# Patient Record
Sex: Male | Born: 1985 | Race: White | Hispanic: No | Marital: Married | Smoking: Former smoker
Health system: Southern US, Community
[De-identification: ages and names within clinical notes are randomized; demographics above are authoritative.]

## PROBLEM LIST (undated history)

## (undated) DIAGNOSIS — J45909 Unspecified asthma, uncomplicated: Secondary | ICD-10-CM

## (undated) DIAGNOSIS — R011 Cardiac murmur, unspecified: Secondary | ICD-10-CM

## (undated) HISTORY — PX: PULMONARY ARTERY BALLOON ANGIOPLASTY: SHX277

---
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
## 2003-09-17 ENCOUNTER — Ambulatory Visit (HOSPITAL_COMMUNITY): Admission: RE | Admit: 2003-09-17 | Discharge: 2003-09-17 | Payer: Self-pay | Admitting: *Deleted

## 2003-09-17 ENCOUNTER — Encounter: Payer: Self-pay | Admitting: Internal Medicine

## 2003-09-17 ENCOUNTER — Encounter: Admission: RE | Admit: 2003-09-17 | Discharge: 2003-09-17 | Payer: Self-pay | Admitting: *Deleted

## 2004-10-16 ENCOUNTER — Ambulatory Visit: Payer: Self-pay | Admitting: Internal Medicine

## 2004-11-05 ENCOUNTER — Ambulatory Visit: Payer: Self-pay

## 2005-10-02 ENCOUNTER — Ambulatory Visit: Payer: Self-pay | Admitting: Internal Medicine

## 2006-12-14 ENCOUNTER — Emergency Department (HOSPITAL_COMMUNITY): Admission: EM | Admit: 2006-12-14 | Discharge: 2006-12-14 | Payer: Self-pay | Admitting: Emergency Medicine

## 2008-04-02 ENCOUNTER — Ambulatory Visit: Payer: Self-pay | Admitting: Internal Medicine

## 2009-01-28 ENCOUNTER — Encounter: Payer: Self-pay | Admitting: Internal Medicine

## 2009-01-28 DIAGNOSIS — Q221 Congenital pulmonary valve stenosis: Secondary | ICD-10-CM | POA: Insufficient documentation

## 2009-02-11 ENCOUNTER — Encounter (INDEPENDENT_AMBULATORY_CARE_PROVIDER_SITE_OTHER): Payer: Self-pay | Admitting: *Deleted

## 2009-05-14 ENCOUNTER — Emergency Department (HOSPITAL_COMMUNITY): Admission: EM | Admit: 2009-05-14 | Discharge: 2009-05-15 | Payer: Self-pay | Admitting: Emergency Medicine

## 2009-05-22 ENCOUNTER — Emergency Department (HOSPITAL_COMMUNITY): Admission: EM | Admit: 2009-05-22 | Discharge: 2009-05-22 | Payer: Self-pay | Admitting: Emergency Medicine

## 2010-02-12 ENCOUNTER — Ambulatory Visit: Payer: Self-pay

## 2010-03-09 ENCOUNTER — Encounter: Payer: Self-pay | Admitting: *Deleted

## 2010-03-20 NOTE — Letter (Signed)
Summary: Pediatric Teaching Program  Pediatric Teaching Program   Imported By: Marylou Mccoy 12/02/2009 10:46:27  _____________________________________________________________________  External Attachment:    Type:   Image     Comment:   External Document

## 2010-04-11 ENCOUNTER — Encounter: Payer: Self-pay | Admitting: Internal Medicine

## 2010-04-11 ENCOUNTER — Ambulatory Visit (INDEPENDENT_AMBULATORY_CARE_PROVIDER_SITE_OTHER): Payer: Self-pay | Admitting: Internal Medicine

## 2010-04-11 ENCOUNTER — Ambulatory Visit (HOSPITAL_COMMUNITY): Payer: Self-pay | Attending: Internal Medicine

## 2010-04-11 ENCOUNTER — Other Ambulatory Visit: Payer: Self-pay | Admitting: Internal Medicine

## 2010-04-11 DIAGNOSIS — F17200 Nicotine dependence, unspecified, uncomplicated: Secondary | ICD-10-CM | POA: Insufficient documentation

## 2010-04-11 DIAGNOSIS — Q231 Congenital insufficiency of aortic valve: Secondary | ICD-10-CM | POA: Insufficient documentation

## 2010-04-11 DIAGNOSIS — I369 Nonrheumatic tricuspid valve disorder, unspecified: Secondary | ICD-10-CM

## 2010-04-11 DIAGNOSIS — R55 Syncope and collapse: Secondary | ICD-10-CM

## 2010-04-24 NOTE — Assessment & Plan Note (Signed)
Summary: f2y/per Annice Pih rn/mb/ac   Visit Type:  2 year follow up   CC:  shortness of breath. dizziness.  History of Present Illness: IDENTIFICATION:  Mr. Roger Dudley is a 25 year old who I saw a few years ago in clinic in 2010.  He has a history of pulmonic stenosis, underwent pulmonary valvuloplasty at age 61, was left with mild stenosis and mild-to-moderate insufficiency.  Last echocardiogram was done in 2006 since seen he has done well.  He denies chst pains.  Breahing is good.  He notes occasional dizziness with change in position.  NO syncope.  No palpitations. he continues to smoke.  Is down to 1/2 ppd.  Preventive Screening-Counseling & Management  Alcohol-Tobacco     Smoking Status: current  Caffeine-Diet-Exercise     Does Patient Exercise: yes      Drug Use:  yes.    Current Medications (verified): 1)  Proventil Hfa 108 (90 Base) Mcg/act Aers (Albuterol Sulfate) .... 2 Puffs Every 4-6 Hours As Needed  Allergies (verified): 1)  ! Asa  Past History:  Past medical, surgical, family and social histories (including risk factors) reviewed, and no changes noted (except as noted below).  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 Family History Coronary Heart Disease male < 45:  Family History of CVA or Stroke:  Family History of Hyperlipidemia:  Family History of Hypertension:  Family History of Seizure Disorder:   Social History: Reviewed history from 04/09/2010 and no changes required. smoke a 1/2 pack per day Full Time Single  Tobacco Use - Yes.  Alcohol Use - no Regular Exercise - yes / tries too Drug Use - yes Smoking Status:  current Does Patient Exercise:  yes Drug Use:  yes  Review of Systems       All systmes reivewed.  Neg to the above problem except as noted above.  Vital Signs:  Patient profile:   25 year old male Height:       73 inches Weight:      188 pounds BMI:     24.89 Pulse rate:   52 / minute BP sitting:   102 / 65  (left arm) Cuff size:   regular  Vitals Entered By: Caralee Ates CMA (April 11, 2010 1:02 PM)  Physical Exam  Additional Exam:  patient is in NAD HEENT:  Normocephalic, atraumatic. EOMI, PERRLA.  Neck: JVP is normal. No thyromegaly. No bruits.  Lungs: clear to auscultation. No rales no wheezes.  Heart: Regular rate and rhythm. Normal S1, S2. No S3.   Gr I/VI systolid mumur LUSB.  No RV heave.   PMI not displaced.  Abdomen:  Supple, nontender. Normal bowel sounds. No masses. No hepatomegaly.  Extremities:   Good distal pulses throughout. No lower extremity edema.  Musculoskeletal :moving all extremities.  Neuro:   alert and oriented x3.    EKG  Procedure date:  04/11/2010  Findings:      Sinus bradycardia  45 bpm.  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  PS is mild.  PI is mild to moderate.  RV is mildly enlarged but function is normal I would f/u in 2 years with exam.  Problem # 2:  TOBACCO ABUSE (ICD-305.1) COunselled on quitting.  Other Orders: EKG w/ Interpretation (93000)  Patient Instructions: 1)  Your physician wants you to follow-up in:18  months with Dr.Kyle Stansell You will receive a reminder letter in the mail two months in advance. If you don't receive a letter, please call our office to schedule the follow-up appointment.
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
## 2010-07-04 NOTE — Consult Note (Signed)
NAME:  Roger Dudley, Roger Dudley                      ACCOUNT NO.:  [id number]   MEDICAL RECORD NO.:  [id number]                   PATIENT TYPE:  INP   LOCATION:  5007                                 FACILITY:   PHYSICIAN:  Gabrielle Dare. Janee Morn, M.D.             DATE OF BIRTH:  [birth year]   DATE OF CONSULTATION:  04/25/2002  DATE OF DISCHARGE:                                   CONSULTATION   CHIEF COMPLAINT:  Infection right leg.   HISTORY OF PRESENT ILLNESS:  The patient is an 25 year old male with past  medical history of asthma and pulmonary stenosis who complains of increasing  pain and swelling in his right lower leg with redness since last Friday.  He  was seen in the emergency department and treated with Keflex and discharged  and followed up with his primary doctor on Monday.  He was given Rocephin IM  and p.o. Augmentin.  He returned for followup with Dr. Marina Goodell of the [facility]  [facility] today and he was referred to [facility] for admission.  I was asked to  evaluate him for possible incision and drainage of this infection.  Currently the patient is complaining of pain and tenderness in the area.  He  has no other complaints.   PAST MEDICAL HISTORY:  Asthma and pulmonary stenosis, status post  ___________.   PAST SURGICAL HISTORY:  Angiographic valvuloplasty in 1991.   MEDICATIONS:  Albuterol MDI.   ALLERGIES:  ASPIRIN.  He claims to get hives.   SOCIAL HISTORY:  Does not smoke or drink.  He is planning on attending  college in the fall.   FAMILY HISTORY:  His mother has some hypertension and maternal grandmother  and maternal grandfather both have hypertension.  He has two sisters with  asthma.   REVIEW OF SYSTEMS:  GENERAL:  He has been feeling ill with some fever and  chills at home.  PULMONARY:  No current complaints.  CARDIAC:  No  complaints.  ABDOMEN AND GI:  He has had some nausea and vomiting and loss  of appetite.  MUSCULOSKELETAL:  See history of present illness.   PHYSICAL EXAMINATION:  VITAL SIGNS:  Today's temperature 99.1, pulse 94,  respirations 18, blood pressure 122/64, 98%  saturation on room air.  GENERAL:  He is awake and alert.  HEENT:  Pupils are equal, round and reactive.  NECK:  Supple without any adenopathy.  LUNGS:  Clear to auscultation bilaterally.  HEART:  Regular rate and rhythm with a 2/6 systolic ejection murmur audible.  ABDOMEN:  Soft and nontender.  EXTREMITIES:  His right leg has an 8 cm area of fluctuance over his superior  tibia anteriorly.  There is a surrounding area of erythema which extends  nearly the circumference of his calf, extends up to his knee anteriorly and  above his knee posteriorly.  There is no active drainage but there is calor  present.  Marland Kitchen  LABORATORY DATA:  White blood cell count 8.9, hemoglobin 13.6, hematocrit  39.2, platelets 219, sodium 136, potassium 3.8, chloride 102, CO2 28, BUN 8,  creatinine 0.9, glucose 91, AST 38, ALT 42, alk phos 94, and bilirubin 1.2.   ASSESSMENT:  Abscess, right leg.   PLAN:  Excise and drain this abscess under sterile conditions.  Continue IV  antibiotics and do some wound care with packing with Iodoform gauze.  The  patient may need a PICC line if he requires long term antibiotics.   Thank you for asking me to see this patient.                                                  Gabrielle Dare Janee Morn, M.D.    BET/MEDQ  D:  04/25/2002  T:  04/26/2002  Job:  [id number]

## 2010-07-04 NOTE — Discharge Summary (Signed)
NAME:  Roger Dudley, Roger Dudley                      ACCOUNT NO.:  [id number]   MEDICAL RECORD NO.:  [id number]                   PATIENT TYPE:  INP   LOCATION:  5007                                 FACILITY:  [facility]   PHYSICIAN:  Deirdre Peer. Polite, M.D.              DATE OF BIRTH:  1985/06/18   DATE OF ADMISSION:  04/25/2002  DATE OF DISCHARGE:  04/27/2002                                 DISCHARGE SUMMARY   PRIMARY CARE PHYSICIAN:  Dr. Noberto Retort.   CONSULT:  General surgery, Dr. Gabrielle Dare. Janee Morn.   DISCHARGE DIAGNOSES:  1. Right lower extremity cellulitis/abscess.  2. Asthma.   DISCHARGE MEDICATIONS:  1. Augmentin 875 mg twice a day for 10 days.  2. Percocet 5/325 mg one to two tabs every 8 to 10 hours as needed for pain.   PROCEDURES:  1. Incision and drainage of right leg abscess on April 25, 2002 by Dr.     Janee Morn.  2. CT of his right lower extremity, April 25, 2002, revealed subcutaneous     abscess cavity with gas and surgical packing.  There was a small amount     of fluid along the medial aspect of this collection still present, skin     thickening and edema within the right lower leg, no evidence of bony     changes to suggest osteomyelitis.   LABORATORY DATA:  WBC 8.9, RBC 4.61, hemoglobin 13.6, hematocrit 39.2, MCV  84.8.  Sodium 136, potassium 3.8, chloride 102, CO2 28, glucose 91, BUN 8,  creatinine 0.9, calcium 8.7, AST 38, ALT 46.  Wound culture was pending at  discharge.   DISPOSITION:  The patient is being discharged home.   CONDITION ON DISCHARGE:  Condition on discharge is stable.   HISTORY OF PRESENT ILLNESS:  This is an 25 year old man with a four-day  history of right lower extremity erythema, swelling and pain.  The patient  reported that four days prior to admission, he began to have some right  lower extremity swelling and erythema.  The patient came to [facility] ER  four days ago and was given a prescription for Keflex and returned home.  After three days, the patient was seen by Dr. Tama Headings. Marina Goodell, who gave him  an injection and p.o. Augmentin.  The patient was seen again by Dr. Marina Goodell on  the day of admission and was sent to [facility] for admission.  Initial  evaluation noted his right lower extremity to be with 3+ edema, erythema and  warmth, and with a 4 x 5-cm area of fluid collection.  The patient was  admitted for further evaluation and treatment.   HOSPITAL COURSE:  #1 - LEFT LOWER LEG CELLULITIS/ABSCESS:  The patient was  admitted and started on IV antibiotics.  A venous Doppler was obtained; no  evidence of DVT was noted.  There was a significant area of abscess.  A  surgical  consult was obtained; the patient was seen by Dr. Janee Morn.  He  underwent an I&D of his right leg which produced 200 mL of purulent  drainage.  A CT of his right lower extremity was done with no evidence of  osteomyelitis.  Prior to discharge, there was the significant decrease in  swelling, erythema and pain.  Prior to discharge, a verbal consult was  obtained with Dr. Janee Morn, who also agreed that the patient was stable for  discharge with a one-week followup in his office.  He is being discharged  with an additional 10 days of antibiotic therapy.   #2 - ASTHMA:  Asthma was stable throughout this hospitalization.   DISCHARGE INSTRUCTIONS AND FOLLOWUP:  He has a one-week appointment with Dr.  Janee Morn and he has an appointment with his primary care physician in  approximately one and a half weeks.  The patient has been instructed to  complete his antibiotic therapy.     Stephanie Swaziland, NP                      Deirdre Peer. Polite, M.D.    SJ/MEDQ  D:  04/27/2002  T:  04/28/2002  Job:  [id number]   cc:   Gabrielle Dare. Janee Morn, M.D.  [facility]  [address] [place], [place] [address]  Fax: [phone]   Melida Quitter, M.D.  [address]  [place]  [place] [address]  Fax: [phone]

## 2010-07-04 NOTE — Assessment & Plan Note (Signed)
[facility]                              CARDIOLOGY OFFICE NOTE   NAME:Depascale, LYAN MOYANO                   MRN:          [id number]  DATE:10/02/2005                            DOB:          [birth year]    IDENTIFICATION:  Hy is a 25 year old patient who was previously  followed by Dr. Doralee Albino.  I saw him back in August of last year.  He has  a history of pulmonic stenosis, underwent pulmonic valvuloplasty at age 3.  He was left with mild stenosis and mild to moderate pulmonic insufficiency.   Note the patient had an echocardiogram done last September that again showed  an LVF of 55-60%.  RV function was normal.  RV size was normal.  Pulmonic  valve showed mild to moderate PI, mild PS.   In the interim, the patient has done well.  He is very active at work.  His  weight has gone down about 30 pounds.  He is trying to eat healthier.  Has  not been to [employer] in about a year.  He is down to 1-2 cigarettes per  day.   Denies shortness of breath.  No chest pain, no palpitations.   CURRENT MEDICATIONS:  None.   PHYSICAL EXAMINATION:  GENERAL:  Patient is in no distress.  VITAL SIGNS:  Blood pressure 144/79.  On my check, 120/60.  Pulse 68 and  regular.  Weight 262, down from 292 on last visit.  LUNGS:  Clear.  NECK:  No JVD, no bruits.  CARDIAC:  Regular rate and rhythm, S1, S2.  Grade 1-2/6 systolic murmur  heard best at the left upper sternal border, grade 1/6 diastolic murmur  heard at the left sternal border.  ABDOMEN:  Benign.  EXTREMITIES:  Good pulses, no edema.  12-lead EKG.  Normal sinus rhythm, 66  beats per minute.   IMPRESSION:  Mr. Ignasiak is a 25 year old with mild pulmonary stenosis/mild  to moderate pulmonary insufficiency.  He is status post valvuloplasty as a  child.  Clinically, he is doing very well.  Valve sounds like it has not  changed.  I would recommend clinical followup in 18 months.  May back down  after  that somewhat in frequency.  The patient has done great from a  standpoint  of losing weight, cutting back on his cigarettes, and I congratulated him on  this, and again, I will see him back in about 18 months.                               Pricilla Riffle, MD, [facility]    PVR/MedQ  DD:  10/02/2005  DT:  10/02/2005  Job #:  [id number]

## 2010-07-04 NOTE — Op Note (Signed)
   NAME:  RODGERICK, GILLIAND                      ACCOUNT NO.:  [id number]   MEDICAL RECORD NO.:  [id number]                   PATIENT TYPE:  INP   LOCATION:  5007                                 FACILITY:  [facility]   PHYSICIAN:  Gabrielle Dare. Janee Morn, M.D.             DATE OF BIRTH:  1985-12-17   DATE OF PROCEDURE:  04/25/2002  DATE OF DISCHARGE:                                 OPERATIVE REPORT   PREOPERATIVE DIAGNOSIS:  Right leg abscess.   PROCEDURE:  Incision and drainage of right leg abscess at bedside.   CLINICAL NOTE:  The patient is an 25 year old male who has had increasing  cellulitis and erythema of his right leg since last Friday, and he has  failed outpatient management with antibiotic therapy and presents with a  fluctuant area anterior to his right superior tibia.   DESCRIPTION OF PROCEDURE:  Informed consent was obtained.  The area was  prepped and draped in a sterile fashion.  Lidocaine 2% with epinephrine was  used to infiltrate the area.  There was an 8-10 cm area of fluctuance  anterior to his tibia.  After this was accomplished, a vertical incision was  made over the fluctuant area yielding 200-250 mL of gross pus.  This was  evacuated.  The wound was cleaned out using sterile gauze and then was  copiously irrigated with saline.  Upon first evacuation of the pus, cultures  were sent.  After the wound was copiously irrigated, it was packed with half-  inch iodoform gauze and a sterile dressing was applied.  The patient  tolerated the procedure well.                                               Gabrielle Dare Janee Morn, M.D.    BET/MEDQ  D:  04/25/2002  T:  04/26/2002  Job:  [id number]

## 2010-11-26 LAB — URINALYSIS, ROUTINE W REFLEX MICROSCOPIC
Bilirubin Urine: NEGATIVE
Glucose, UA: NEGATIVE
Specific Gravity, Urine: 1.023
Urobilinogen, UA: 1

## 2010-11-26 LAB — URINE MICROSCOPIC-ADD ON

## 2012-02-13 ENCOUNTER — Encounter (HOSPITAL_COMMUNITY): Payer: Self-pay

## 2012-02-13 ENCOUNTER — Emergency Department (HOSPITAL_COMMUNITY): Payer: No Typology Code available for payment source

## 2012-02-13 ENCOUNTER — Emergency Department (HOSPITAL_COMMUNITY)
Admission: EM | Admit: 2012-02-13 | Discharge: 2012-02-13 | Disposition: A | Discharge status: Home / Self Care | Payer: No Typology Code available for payment source | Attending: Emergency Medicine | Admitting: Emergency Medicine

## 2012-02-13 DIAGNOSIS — Y9389 Activity, other specified: Secondary | ICD-10-CM | POA: Insufficient documentation

## 2012-02-13 DIAGNOSIS — J45909 Unspecified asthma, uncomplicated: Secondary | ICD-10-CM | POA: Insufficient documentation

## 2012-02-13 DIAGNOSIS — F17200 Nicotine dependence, unspecified, uncomplicated: Secondary | ICD-10-CM | POA: Insufficient documentation

## 2012-02-13 DIAGNOSIS — R011 Cardiac murmur, unspecified: Secondary | ICD-10-CM | POA: Insufficient documentation

## 2012-02-13 DIAGNOSIS — S298XXA Other specified injuries of thorax, initial encounter: Secondary | ICD-10-CM | POA: Insufficient documentation

## 2012-02-13 DIAGNOSIS — Y92410 Unspecified street and highway as the place of occurrence of the external cause: Secondary | ICD-10-CM | POA: Insufficient documentation

## 2012-02-13 HISTORY — DX: Cardiac murmur, unspecified: R01.1

## 2012-02-13 HISTORY — DX: Unspecified asthma, uncomplicated: J45.909

## 2012-02-13 MED ORDER — ACETAMINOPHEN 500 MG PO TABS
1000.0000 mg | ORAL_TABLET | Freq: Once | ORAL | Status: AC
Start: 2012-02-13 — End: 2012-02-13
  Administered 2012-02-13: 1000 mg via ORAL
  Filled 2012-02-13: qty 2

## 2012-02-13 NOTE — ED Notes (Signed)
Pt discharged.Vital signs stable and GCS 15 
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
## 2012-02-13 NOTE — ED Provider Notes (Signed)
Medical screening examination/treatment/procedure(s) were performed by non-physician practitioner and as supervising physician I was immediately available for consultation/collaboration.   Loren Racer, MD 02/13/12 2221

## 2012-02-13 NOTE — ED Notes (Signed)
Report to Asher Muir, Charity fundraiser.  Pt to go to CDU 9.
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
## 2012-02-13 NOTE — ED Notes (Signed)
Patient arrived to CDU room #9
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
## 2012-02-18 NOTE — ED Provider Notes (Signed)
Medical screening examination/treatment/procedure(s) were performed by non-physician practitioner and as supervising physician I was immediately available for consultation/collaboration.  Gilda Crease, MD 02/18/12 1754

## 2012-12-23 ENCOUNTER — Encounter (INDEPENDENT_AMBULATORY_CARE_PROVIDER_SITE_OTHER): Payer: Self-pay

## 2012-12-23 ENCOUNTER — Ambulatory Visit (INDEPENDENT_AMBULATORY_CARE_PROVIDER_SITE_OTHER): Payer: BC Managed Care – PPO | Admitting: Internal Medicine

## 2012-12-23 ENCOUNTER — Encounter: Payer: Self-pay | Admitting: Internal Medicine

## 2012-12-23 VITALS — BP 114/68 | HR 64 | Ht 73.0 in | Wt 183.8 lb

## 2012-12-23 DIAGNOSIS — Q221 Congenital pulmonary valve stenosis: Secondary | ICD-10-CM

## 2012-12-23 NOTE — Patient Instructions (Addendum)
Your physician has requested that you have an echocardiogram in 1 year. Echocardiography is a painless test that uses sound waves to create images of your heart. It provides your doctor with information about the size and shape of your heart and how well your heart's chambers and valves are working. This procedure takes approximately one hour. There are no restrictions for this procedure.     Your physician wants you to follow-up in: 1 year with Echo 1 week before appointment. You will receive a reminder letter in the mail two months in advance. If you don't receive a letter, please call our office to schedule the follow-up appointment.

## 2012-12-23 NOTE — Progress Notes (Signed)
HPI Illness:  IDENTIFICATION: Roger Dudley is a 27 year old who I saw a few years ago  in clinic in 2010. He has a history of pulmonic stenosis,  underwent pulmonary valvuloplasty at age 36, was left with mild stenosis  and mild-to-moderate insufficiency. Last echocardiogram was done in  2012  RV with  normal funciton  PI was reported mild  Since seen he has done OK  A studuent in Biochem at [employer] to go to med school.   Going to [place] with girlfriend  Denies CP  Breathing OK  No palpitaitons. Walks the dog occasionally  Does not exercise Continues to smoke 6 cigs per day.    Allergies  Allergen Reactions  . Aspirin Other (See Comments)    Welts     No current outpatient prescriptions on file.   No current facility-administered medications for this visit.    Past Medical History  Diagnosis Date  . Asthma   . Heart murmur     Past Surgical History  Procedure Laterality Date  . Pulmonary artery balloon angioplasty      No family history on file.  History   Social History  . Marital Status: Single    Spouse Name: N/A    Number of Children: N/A  . Years of Education: N/A   Occupational History  . Not on file.   Social History Main Topics  . Smoking status: Current Every Day Smoker    Types: Cigarettes  . Smokeless tobacco: Never Used     Comment: pt has cut down to 5-6 cigarettes daily  . Alcohol Use: Yes     Comment: rarely  . Drug Use: Yes    Special: Marijuana  . Sexual Activity: Not on file   Other Topics Concern  . Not on file   Social History Narrative  . No narrative on file    Review of Systems:  All systems reviewed.  They are negative to the above problem except as previously stated.  Vital Signs: BP 114/68  Pulse 64  Ht 6\' 1"  (1.854 m)  Wt 183 lb 12.8 oz (83.371 kg)  BMI 24.25 kg/m2  Physical Exam Patient is in NAD HEENT:  Normocephalic, atraumatic. EOMI, PERRLA.  Neck: JVP is normal.  No bruits.  Lungs: clear to  auscultation. No rales no wheezes.  Heart: Regular rate and rhythm. Normal S1, S2. No S3.   I-II/VI systolic murmur LUSB  Gr I/VI diastolic murmur LSB  No RV heave  . PMI not displaced.  Abdomen:  Supple, nontender. Normal bowel sounds. No masses. No hepatomegaly.  Extremities:   Good distal pulses throughout. No lower extremity edema.  Musculoskeletal :moving all extremities.  Neuro:   alert and oriented x3.  CN II-XII grossly intact.   Assessment and Plan:  1.  Pulmonary valve disorder  No evidence of RV overload on exam  I would recomm f/u with echo in 1 year    2.  Tobacco  Counselled again on risks and the need to quit.    3.  HCM  I recomm he increase his activity level     F/U in 1 year with echo.

## 2012-12-26 ENCOUNTER — Telehealth: Payer: Self-pay | Admitting: Internal Medicine

## 2012-12-26 NOTE — Telephone Encounter (Signed)
Patient is having dental procedure 01/09/2013 and needs pre antibiotics, his only allergy is ASA, send to [facility], call patient when done.

## 2012-12-26 NOTE — Telephone Encounter (Signed)
New Problem  Pt is in need of pre meds for dental appt on nov 24// please call it in.Rhina Brackett to medications department

## 2012-12-26 NOTE — Telephone Encounter (Signed)
New Problem  Pt is in need of pre meds for dental appt on nov 24// please call it in.Marland Kitchen

## 2012-12-27 MED ORDER — AMOXICILLIN 500 MG PO CAPS: ORAL_CAPSULE | ORAL | Status: DC

## 2012-12-27 NOTE — Telephone Encounter (Signed)
Script sent to the pharm. Pt made aware

## 2012-12-28 NOTE — Telephone Encounter (Signed)
Spoke to patient re travel Would need Typhoid vaccination beyond standard vaccinations   Also recomm bug spray to prevent mosquito born infections (Chikungunya) Can set up appt in travel clnic at [facility] or get at health department.   Travel clinic number is [phone]  He is in middle of preparing for exams Will call back
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
## 2013-08-20 IMAGING — CT CT T SPINE W/O CM
1 of 2 series · 11 of 14 positions shown, 14 images · non-contrast
Comparison: Plain films 02/05/2012

CLINICAL DATA: Potential wedge compression fracture T12

CT THORACIC SPINE WITHOUT CONTRAST
TECHNIQUE: Multidetector CT imaging of the thoracic spine was
performed without intravenous contrast administration. Multiplanar
CT image reconstructions were also generated

[Series 8: 2mm soft tissue · axial · 0.28mm/px · z∈[-355,-55]mm · 11 of 360 slices shown, 14 images]
[im 30/360  soft-tissue]
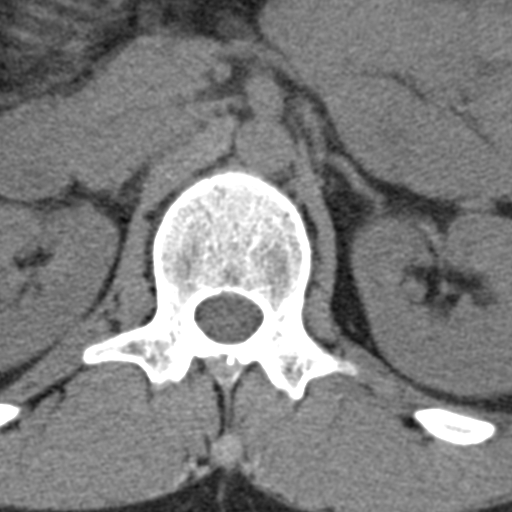
[im 30/360  bone]
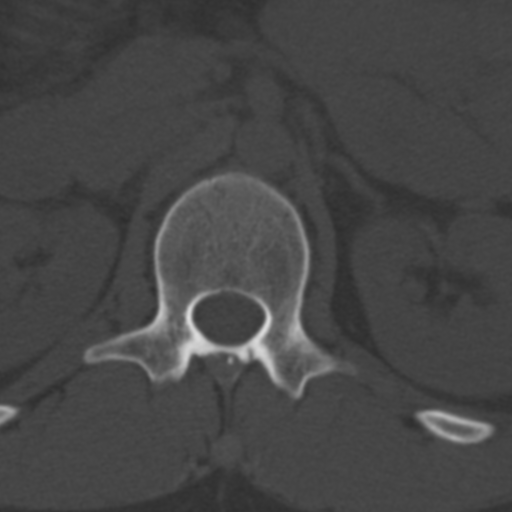
[im 60/360  bone]
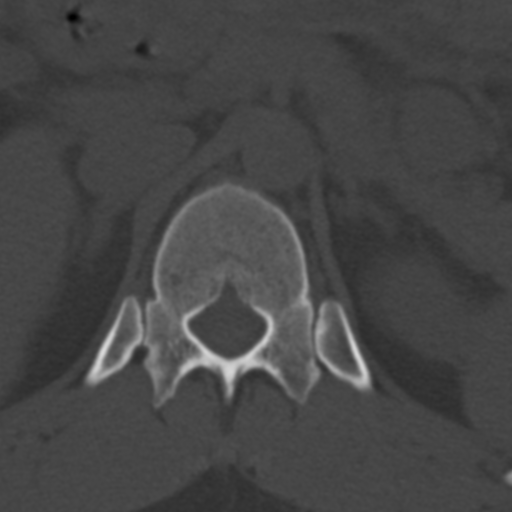
[im 90/360  bone]
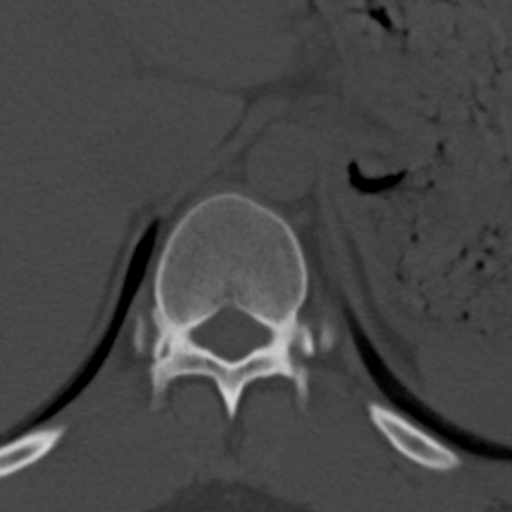
[im 120/360  bone]
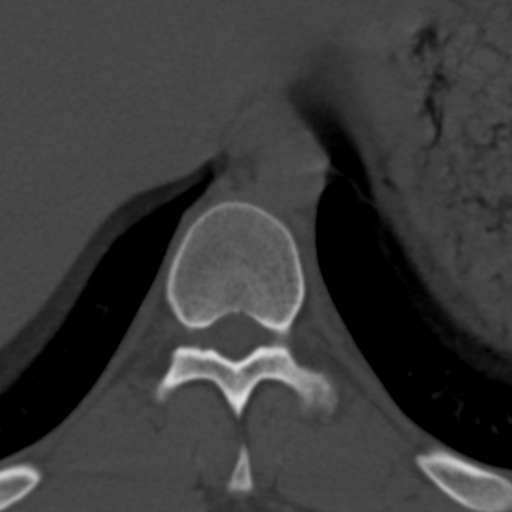
[im 150/360  soft-tissue]
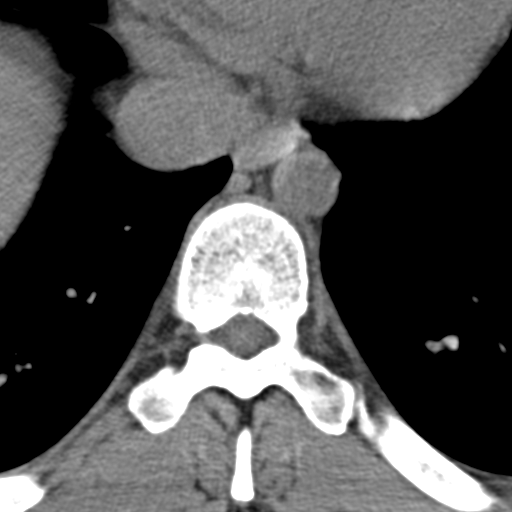
[im 150/360  bone]
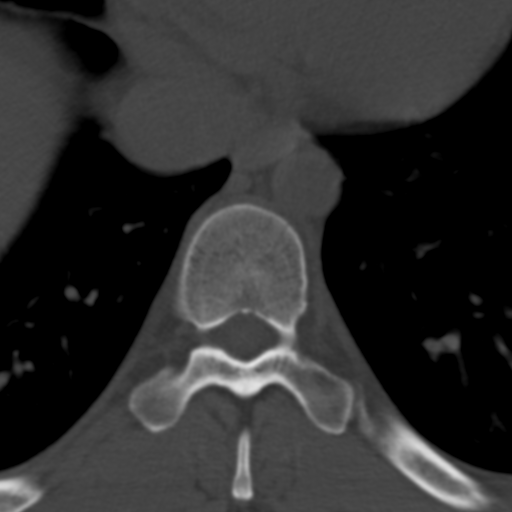
[im 180/360  bone]
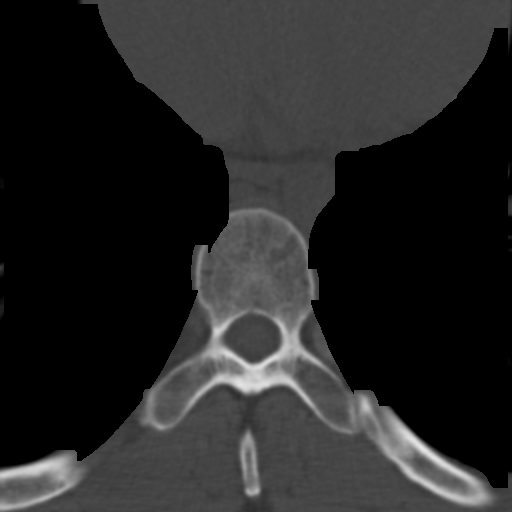
[im 210/360  bone]
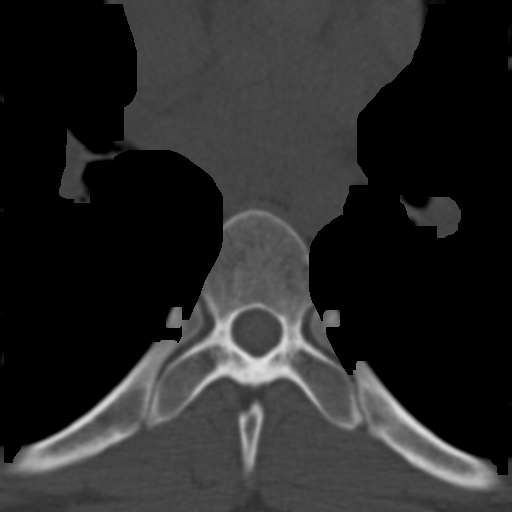
[im 240/360  bone]
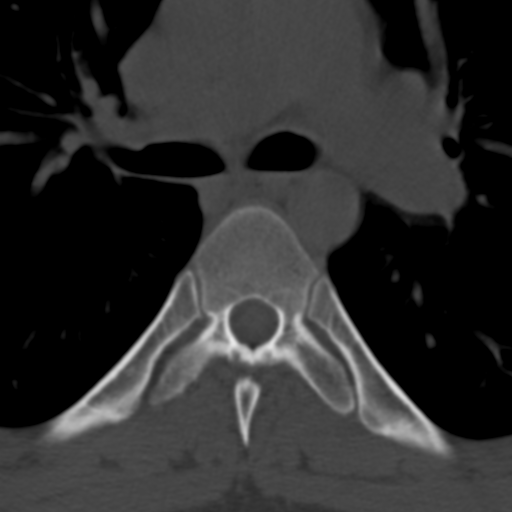
[im 270/360  soft-tissue]
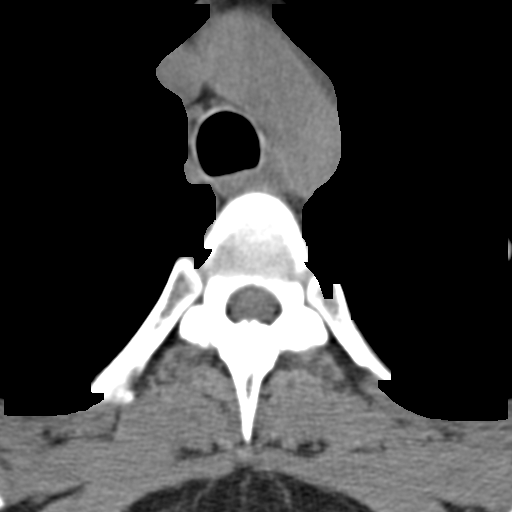
[im 270/360  bone]
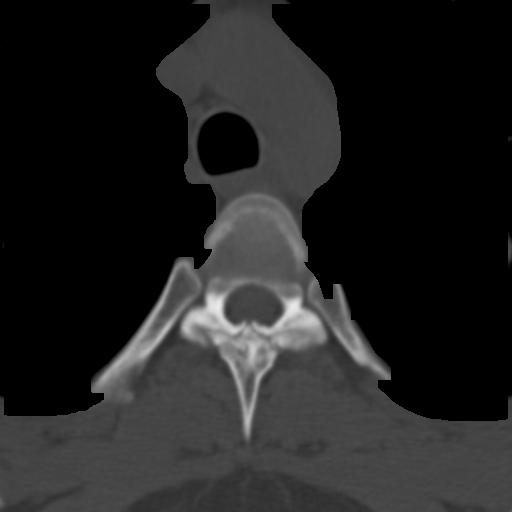
[im 300/360  bone]
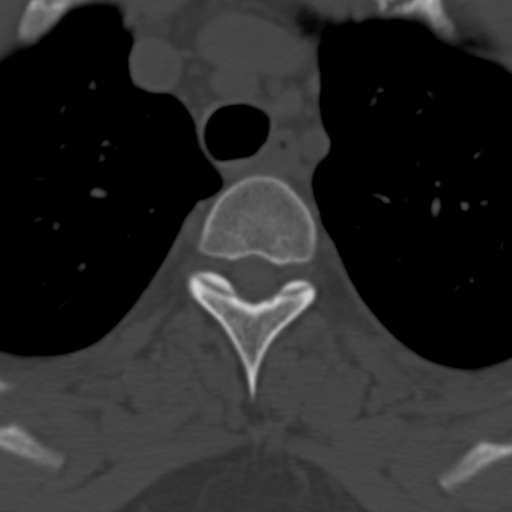
[im 330/360  bone]
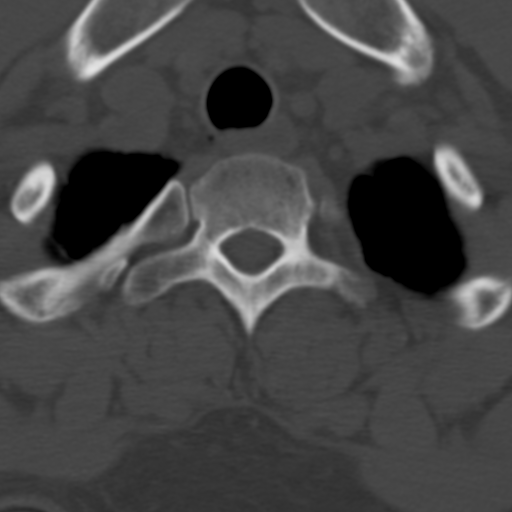

[11 of 14 positions shown; findings below may reference images not displayed]

FINDINGS: The end plate abnormality is again demonstrated at T12
body.  There is sclerosis of the endplate spurring and a small
amount gas associated with this spurring.  The gas indicates a
chronic degenerative process rather than traumatic.  There is no
evidence of paraspinal hematoma TO suggest acute fracture. Disc
space fairly well maintained..  No evidence retropulsion.
IMPRESSION: Favor degenerative spurring at the superior endplate of T12 rather
than traumatic fracture.

## 2013-10-06 ENCOUNTER — Encounter: Payer: Self-pay | Admitting: Internal Medicine

## 2014-05-28 ENCOUNTER — Telehealth: Payer: Self-pay | Admitting: *Deleted

## 2014-05-28 MED ORDER — AMOXICILLIN 500 MG PO TABS: ORAL_TABLET | ORAL | Status: DC

## 2014-05-28 NOTE — Telephone Encounter (Signed)
Patient has a dental appointment on April 22 and [facility] Please advise. Thanks, MI

## 2014-05-28 NOTE — Telephone Encounter (Signed)
Sent amox to pharmacy with note that patient needs to make appointment.

## 2015-04-25 ENCOUNTER — Ambulatory Visit (INDEPENDENT_AMBULATORY_CARE_PROVIDER_SITE_OTHER): Payer: Self-pay | Admitting: Internal Medicine

## 2015-04-25 ENCOUNTER — Encounter: Payer: Self-pay | Admitting: Internal Medicine

## 2015-04-25 VITALS — BP 122/80 | HR 74 | Ht 74.0 in | Wt 170.0 lb

## 2015-04-25 DIAGNOSIS — Z720 Tobacco use: Secondary | ICD-10-CM

## 2015-04-25 DIAGNOSIS — I370 Nonrheumatic pulmonary valve stenosis: Secondary | ICD-10-CM

## 2015-04-25 NOTE — Patient Instructions (Signed)
Your physician recommends that you continue on your current medications as directed. Please refer to the Current Medication list given to you today. Your physician wants you to follow-up in: 1 year with Dr. Tenny Crawoss.   You will receive a reminder letter in the mail two months in advance. If you don't receive a letter, please call our office to schedule the follow-up appointment.  Once you get your health insurance, please call to schedule appointment for an echocardiogram.

## 2015-04-25 NOTE — Progress Notes (Signed)
   Cardiology Office Note   Date:  04/25/2015   ID:  Roger DecemberBrandon D Mercado, DOB April 01, 1985, MRN [id number]  PCP:  Default, Provider, MD  Cardiologist:   Dietrich PatesPaula Cesareo Vickrey, MD   F/U of pulmonic valve dz    History of Present Illness: Roger Dudley is a 30 y.o. male with a history of PS  I saw him in [address]201  He underwent pulm valvuloplasty at age 89.  Last echo showed no signif stenosis.  Gradient across valve 15 mm Hg  Mild PI  Since seen the pt has done well  NO CP  Occaisonally gets head rushes is stands quick or bends Drinks ample fluids Still smoking  Working at cutting back  Down to 10 cigs per day     Outpatient Prescriptions Prior to Visit  Medication Sig Dispense Refill  . amoxicillin (AMOXIL) 500 MG tablet Take 4 tablets (2000 mg) one hour prior to dental procedure 8 tablet 1   No facility-administered medications prior to visit.     Allergies:   Aspirin   Past Medical History  Diagnosis Date  . Asthma   . Heart murmur     Past Surgical History  Procedure Laterality Date  . Pulmonary artery balloon angioplasty       Social History:  The patient  reports that he has been smoking Cigarettes.  He has never used smokeless tobacco. He reports that he drinks alcohol. He reports that he uses illicit drugs (Marijuana).   Family History:  The patient's family history is not on file.    ROS:  Please see the history of present illness. All other systems are reviewed and  Negative to the above problem except as noted.    PHYSICAL EXAM: VS:  BP 122/80 mmHg  Pulse 74  Ht 6\' 2"  (1.88 m)  Wt 170 lb (77.111 kg)  BMI 21.82 kg/m2  GEN: Well nourished, well developed, in no acute distress HEENT: normal Neck: no JVD, carotid bruits, or masses Cardiac: RRR; Gr II/VI systolic murmur LUSB, rubs, or gallops,no edema  Respiratory:  clear to auscultation bilaterally, normal work of breathing GI: soft, nontender, nondistended, + BS  No hepatomegaly  MS: no deformity Moving all  extremities   Skin: warm and dry, no rash Neuro:  Strength and sensation are intact Psych: euthymic mood, full affect   EKG:  EKG is not ordered today.   Lipid Panel No results found for: CHOL, TRIG, HDL, CHOLHDL, VLDL, LDLCALC, LDLDIRECT    Wt Readings from Last 3 Encounters:  04/25/15 170 lb (77.111 kg)  12/23/12 183 lb 12.8 oz (83.371 kg)  04/11/10 188 lb (85.276 kg)      ASSESSMENT AND PLAN:  1.  PV dz  Exma without diastolic murmur Last echo was in 2012  No signs of RV enlargement on exam I wuld continue to follow  Pt is without insurance  When he gets insurance I would get echo to evaluate valve  No restriction on acitvity  2.  Tob  Counselled at length on quitting    F/U in 1 year  He will call when he has insurance   Signed, Dietrich PatesPaula Tania Perrott, MD  04/25/2015 12:04 PM    [facility] [address], [place], [place]  [address] Phone: [phone]; Fax: [phone]

## 2015-04-26 ENCOUNTER — Telehealth: Payer: Self-pay | Admitting: Internal Medicine

## 2015-04-26 NOTE — Telephone Encounter (Signed)
New message      Pt was seen yesterday.  He forgot to ask for something to help him stop smoking.  Will Dr Tenny Crawoss call him in something at [facility]?

## 2015-04-26 NOTE — Telephone Encounter (Signed)
OK to try chantix  Needs to get closer to quitting though before starting  Otherwise try nicotin patch or gum but cannot smoke while using.

## 2015-04-26 NOTE — Telephone Encounter (Signed)
Will forward to Dr. Tenny Crawoss for any recommendation.

## 2015-05-01 NOTE — Telephone Encounter (Signed)
Spoke with patient. Since he called, he and his fiance have been "using the buddy system" and he has not smoked in 3 days. He does not want to re introduce nicotine into his body at this time by starting patches/gum/medication.

## 2015-06-06 ENCOUNTER — Ambulatory Visit (INDEPENDENT_AMBULATORY_CARE_PROVIDER_SITE_OTHER): Payer: Self-pay | Admitting: Physician Assistant

## 2015-06-06 VITALS — BP 114/66 | HR 59 | Temp 98.0°F | Resp 16 | Ht 72.0 in | Wt 169.0 lb

## 2015-06-06 DIAGNOSIS — L989 Disorder of the skin and subcutaneous tissue, unspecified: Secondary | ICD-10-CM

## 2015-06-06 MED ORDER — CEPHALEXIN 500 MG PO CAPS: 500.0000 mg | ORAL_CAPSULE | Freq: Two times a day (BID) | ORAL | Status: AC

## 2015-06-06 MED ORDER — CEPHALEXIN 500 MG PO CAPS: 500.0000 mg | ORAL_CAPSULE | Freq: Two times a day (BID) | ORAL | Status: DC

## 2015-06-06 NOTE — Patient Instructions (Addendum)
Apply warm compresses. Take tylenol for pain You may fill the antibiotic if symptoms are worsening, however I do not anticipate that happening. Return if symptoms do not improve.    IF you received an x-ray today, you will receive an invoice from [facility]. Please contact [facility] at [phone] with questions or concerns regarding your invoice.   IF you received labwork today, you will receive an invoice from [facility]. Please contact [facility] at [phone] with questions or concerns regarding your invoice.   Our billing staff will not be able to assist you with questions regarding bills from these companies.  You will be contacted with the lab results as soon as they are available. The fastest way to get your results is to activate your My Chart account. Instructions are located on the last page of this paperwork. If you have not heard from us regarding the results in 2 weeks, please contact this office.

## 2015-06-06 NOTE — Progress Notes (Signed)
[facility] [address], [place] [address] [phone]  Date:  06/06/2015   Name:  Roger Dudley   DOB:  11-08-1985   MRN:  [id number]  PCP:  Default, Provider, MD    Chief Complaint: Cellulitis   History of Present Illness:  This is a 30 y.o. male with PMH former tob abuse, congenital pulmonary valve stenosis who is presenting with 4 days of left lower leg "cellulitis". States he noticed a tender red spot on left lower leg 4 days ago. Has been staying about the same, maybe a little less tender now. He is worried because he had right leg cellulitis a few years ago and states "this is how it started". States it burns some with standing. No swelling, fever, chills. Has not tried anything for his symptoms. He does not recall any trauma or insect bites. No itching at the site.  Review of Systems:  Review of Systems See HPI  Patient Active Problem List   Diagnosis Date Noted  . TOBACCO ABUSE 04/11/2010  . PULMONARY VALVE STENOSIS, CONGENITAL 01/28/2009    Prior to Admission medications   Medication Sig Start Date End Date Taking? Authorizing Provider  amoxicillin (AMOXIL) 500 MG tablet Take 4 tablets (2000 mg) one hour prior to dental procedure Patient not taking: Reported on 06/06/2015 05/28/14   Pricilla Riffle, MD    Allergies  Allergen Reactions  . Aspirin Other (See Comments)    Welts     Past Surgical History  Procedure Laterality Date  . Pulmonary artery balloon angioplasty      Social History  Substance Use Topics  . Smoking status: Former Smoker    Types: Cigarettes  . Smokeless tobacco: Never Used     Comment: pt has cut down to 5-6 cigarettes daily  . Alcohol Use: 0.0 oz/week    0 Standard drinks or equivalent per week     Comment: rarely    History reviewed. No pertinent family history.  Medication list has been reviewed and updated.  Physical Examination:  Physical Exam  Constitutional: He is oriented to person, place,  and time. He appears well-developed and well-nourished. No distress.  HENT:  Head: Normocephalic and atraumatic.  Right Ear: Hearing normal.  Left Ear: Hearing normal.  Nose: Nose normal.  Eyes: Conjunctivae and lids are normal. Right eye exhibits no discharge. Left eye exhibits no discharge. No scleral icterus.  Pulmonary/Chest: Effort normal. No respiratory distress.  Musculoskeletal: Normal range of motion.  Neurological: He is alert and oriented to person, place, and time.  Skin: Skin is warm, dry and intact.  1.5 cm tender nodule with total 2 cm area of erythema and mild swelling. Nodule is firm and mobile. No fluctuance. No warmth. No swelling in feet, ankles or calves. Pedal pulses 2+ and symmetric.  Psychiatric: He has a normal mood and affect. His speech is normal and behavior is normal. Thought content normal.   BP 114/66 mmHg  Pulse 59  Temp(Src) 98 F (36.7 C) (Oral)  Resp 16  Ht 6' (1.829 m)  Wt 169 lb (76.658 kg)  BMI 22.92 kg/m2  SpO2 98%  Assessment and Plan:  1. Skin lesion of left leg Nodule seems to be d/t inflammation, possibly d/t trauma or insect bite?Marland Kitchen Does not seem infectious. Advised apply warm compresses and take ibuprofen. Gave print out of keflex to fill if seems to be getting worse. Otherwise, do not fill and should go away on own. - cephALEXin (  KEFLEX) 500 MG capsule; Take 1 capsule (500 mg total) by mouth 2 (two) times daily. MAY FILL 06/07/15 - 06/16/15 ONLY  Dispense: 20 capsule; Refill: 0   Roswell MinersNicole V. Dyke BrackettBush, PA-C, MHS [facility]  06/06/2015

## 2015-06-11 ENCOUNTER — Other Ambulatory Visit: Payer: Self-pay | Admitting: *Deleted

## 2015-06-11 ENCOUNTER — Other Ambulatory Visit: Payer: Self-pay | Admitting: Internal Medicine

## 2015-06-11 MED ORDER — AMOXICILLIN 500 MG PO TABS: ORAL_TABLET | ORAL | Status: AC

## 2015-06-11 NOTE — Telephone Encounter (Signed)
Sent refill

## 2015-06-11 NOTE — Telephone Encounter (Signed)
Patient stated that he has a dental appointment tomorrow morning. Ok to refill? Please advise. Thanks, MI

## 2015-06-12 NOTE — Telephone Encounter (Signed)
Yes, I think I sent refill on 4/25.  Can you please verify?

## 2016-06-17 ENCOUNTER — Encounter: Payer: Self-pay | Admitting: *Deleted

## 2016-07-05 NOTE — Progress Notes (Deleted)
   Cardiology Office Note   Date:  07/05/2016   ID:  Roger DecemberBrandon D Rout, DOB 13-Jun-1985, MRN [id number]  PCP:  Default, Provider, MD  Cardiologist:   Dietrich PatesPaula Quana Chamberlain, MD       History of Present Illness: Roger Dudley is a 31 y.o. male with a history of pulmonoic stenosis, s/p valvuloplasty at age 31    I saw him in March 2017   Pt has histlr of smoking Did not have insurance when I saw him last         No outpatient prescriptions have been marked as taking for the 07/06/16 encounter (Appointment) with Pricilla Riffleoss, Machael Raine V, MD.     Allergies:   Aspirin   Past Medical History:  Diagnosis Date  . Asthma   . Heart murmur     Past Surgical History:  Procedure Laterality Date  . PULMONARY ARTERY BALLOON ANGIOPLASTY    . PULMONARY VALVULOPLASTY  1991     Social History:  The patient  reports that he has quit smoking. His smoking use included Cigarettes. He has never used smokeless tobacco. He reports that he drinks alcohol. He reports that he uses drugs, including Marijuana.   Family History:  The patient's family history includes CAD in his other; CVA in his other; Heart Problems in his other; Hyperlipidemia in his other; Hypertension in his other; Seizures in his other; Stroke in his other.    ROS:  Please see the history of present illness. All other systems are reviewed and  Negative to the above problem except as noted.    PHYSICAL EXAM: VS:  There were no vitals taken for this visit.  GEN: Well nourished, well developed, in no acute distress  HEENT: normal  Neck: no JVD, carotid bruits, or masses Cardiac: RRR; no murmurs, rubs, or gallops,no edema  Respiratory:  clear to auscultation bilaterally, normal work of breathing GI: soft, nontender, nondistended, + BS  No hepatomegaly  MS: no deformity Moving all extremities   Skin: warm and dry, no rash Neuro:  Strength and sensation are intact Psych: euthymic mood, full affect   EKG:  EKG is ordered today.   Lipid  Panel No results found for: CHOL, TRIG, HDL, CHOLHDL, VLDL, LDLCALC, LDLDIRECT    Wt Readings from Last 3 Encounters:  06/06/15 169 lb (76.7 kg)  04/25/15 170 lb (77.1 kg)  12/23/12 183 lb 12.8 oz (83.4 kg)      ASSESSMENT AND PLAN:     Current medicines are reviewed at length with the patient today.  The patient does not have concerns regarding medicines.  Signed, Dietrich PatesPaula Reanna Scoggin, MD  07/05/2016 8:54 PM    [facility] [address], [place], [place]  [address] Phone: [phone]([phone]; Fax: [phone]) [phone]

## 2016-07-06 ENCOUNTER — Ambulatory Visit: Payer: Self-pay | Admitting: Internal Medicine

## 2016-07-06 ENCOUNTER — Telehealth: Payer: Self-pay | Admitting: Internal Medicine

## 2016-07-06 NOTE — Telephone Encounter (Signed)
Patient with pulmonic valve stenosis, seen March 2017 by Dr. Tenny Crawoss.  States at that time they discussed the possiblity of a stress test.  Advised none was scheduled at that time.  He denies chest pain or tightness, not SOB, has no new symptoms at all since last visit.  States his legs are tingling, both of them, this is also not new.  Has stopped smoking a year ago.   He is aware I am routing to Dr. Tenny Crawoss and as of right now she will see him on 08/07/16 (his r/s appointment).   If doctor wants to order anything sooner I will call him back.  He does not have insurance but willing/able to make payments.

## 2016-07-06 NOTE — Telephone Encounter (Signed)
New Message ° °Pt call requesting to speak with RN about scheduling a stress test. Please call back to discuss ° °

## 2016-07-07 NOTE — Telephone Encounter (Signed)
I saw him one year ago He should have echo, even limited echo to eval pulmonic valve (PS/PI) and RV/LV functionI did not sched it because he did not have insureance  At some pt eneeds to get

## 2016-07-09 ENCOUNTER — Other Ambulatory Visit: Payer: Self-pay | Admitting: *Deleted

## 2016-07-09 NOTE — Telephone Encounter (Signed)
Check price on 2  If limited echo need the data mentioned in initial not

## 2016-07-09 NOTE — Telephone Encounter (Signed)
Informed patient that Dr. Tenny Crawoss recommends echo.  Pt will be private pay.   [place].  Has appointment with Dr. Tenny Crawoss on 08/07/16 at 4:20 pm.  Will route to Dr. Tenny Crawoss to clarify echo/limited echo, then will order and send to scheduling.

## 2016-08-05 ENCOUNTER — Other Ambulatory Visit: Payer: Self-pay | Admitting: *Deleted

## 2016-08-05 DIAGNOSIS — I370 Nonrheumatic pulmonary valve stenosis: Secondary | ICD-10-CM

## 2016-08-05 NOTE — Telephone Encounter (Signed)
Order placed for echo per billing they spoke with patient and he is waiting for call to schedule this.   [facility]  Pt has appt with Dr. Tenny Crawoss that should be moved until after echo.  Left message for patient to call back to inform of this.

## 2016-08-06 NOTE — Progress Notes (Deleted)
   Cardiology Office Note   Date:  08/06/2016   ID:  Roger Dudley, DOB 05-Apr-1985, MRN [id number]  PCP:  Default, Provider, MD  Cardiologist:   Dietrich PatesPaula Rogelio Winbush, MD       History of Present Illness: Roger Dudley is a 31 y.o. male with a history of PS  He is s/p valvuloplasty at age 89  Echo showed gradient of 15 mm  Mild AD  I saw him in March 2017       No outpatient prescriptions have been marked as taking for the 08/07/16 encounter (Appointment) with Pricilla Riffleoss, Denzil Bristol V, MD.     Allergies:   Aspirin   Past Medical History:  Diagnosis Date  . Asthma   . Heart murmur     Past Surgical History:  Procedure Laterality Date  . PULMONARY ARTERY BALLOON ANGIOPLASTY    . PULMONARY VALVULOPLASTY  1991     Social History:  The patient  reports that he has quit smoking. His smoking use included Cigarettes. He has never used smokeless tobacco. He reports that he drinks alcohol. He reports that he uses drugs, including Marijuana.   Family History:  The patient's family history includes CAD in his other; CVA in his other; Heart Problems in his other; Hyperlipidemia in his other; Hypertension in his other; Seizures in his other; Stroke in his other.    ROS:  Please see the history of present illness. All other systems are reviewed and  Negative to the above problem except as noted.    PHYSICAL EXAM: VS:  There were no vitals taken for this visit.  GEN: Well nourished, well developed, in no acute distress  HEENT: normal  Neck: no JVD, carotid bruits, or masses Cardiac: RRR; no murmurs, rubs, or gallops,no edema  Respiratory:  clear to auscultation bilaterally, normal work of breathing GI: soft, nontender, nondistended, + BS  No hepatomegaly  MS: no deformity Moving all extremities   Skin: warm and dry, no rash Neuro:  Strength and sensation are intact Psych: euthymic mood, full affect   EKG:  EKG is ordered today.   Lipid Panel No results found for: CHOL, TRIG, HDL,  CHOLHDL, VLDL, LDLCALC, LDLDIRECT    Wt Readings from Last 3 Encounters:  06/06/15 76.7 kg (169 lb)  04/25/15 77.1 kg (170 lb)  12/23/12 83.4 kg (183 lb 12.8 oz)      ASSESSMENT AND PLAN:     Current medicines are reviewed at length with the patient today.  The patient does not have concerns regarding medicines.  Signed, Dietrich PatesPaula Josiel Gahm, MD  08/06/2016 10:29 PM    [facility] [address], [place], [place]  [address] Phone: [phone](336) [phone]; Fax: [phone]) [phone]

## 2016-08-07 ENCOUNTER — Ambulatory Visit: Payer: Self-pay | Admitting: Internal Medicine

## 2016-08-22 NOTE — Progress Notes (Signed)
Cardiology Office Note   Date:  08/24/2016   ID:  Roger Dudley, DOB 1985-11-13, MRN [id number]  PCP:  Default, Provider, MD  Cardiologist:   Dietrich PatesPaula Shivaay Stormont, MD   F/U of pulmonic valve dz    History of Present Illness: Roger Dudley is a 31 y.o. male with a history of PS  I saw him in [address]  He underwent pulm valvuloplasty at age 89.  Last echo showed no signif stenosis.  Gradient across valve 15 mm Hg  Mild PI I saw the pt in 2017 Since seen he says he gets SOB even with standing    Notes fatgiue     Outpatient Medications Prior to Visit  Medication Sig Dispense Refill  . amoxicillin (AMOXIL) 500 MG tablet Take 4 tablets (2000 mg) one hour prior to dental procedure 8 tablet 1   No facility-administered medications prior to visit.      Allergies:   Aspirin   Past Medical History:  Diagnosis Date  . Asthma   . Heart murmur     Past Surgical History:  Procedure Laterality Date  . PULMONARY ARTERY BALLOON ANGIOPLASTY    . PULMONARY VALVULOPLASTY  1991     Social History:  The patient  reports that he has quit smoking. His smoking use included Cigarettes. He has never used smokeless tobacco. He reports that he drinks alcohol. He reports that he uses drugs, including Marijuana.   Family History:  The patient's family history includes CAD in his other; CVA in his other; Heart Problems in his other; Hyperlipidemia in his other; Hypertension in his other; Seizures in his other; Stroke in his other.    ROS:  Please see the history of present illness. All other systems are reviewed and  Negative to the above problem except as noted.    PHYSICAL EXAM: VS:  BP (!) 104/50   Pulse (!) 56   Ht 6' (1.829 m)   Wt 76.4 kg (168 lb 6.4 oz)   SpO2 97%   BMI 22.84 kg/m   GEN: Well nourished, well developed, in no acute distress HEENT: normal Neck: no JVD, carotid bruits, or masses Cardiac: RRR; Gr II/VI systolic murmur LUSB, rubs, or gallops,no edema  Respiratory:  clear  to auscultation bilaterally, normal work of breathing GI: soft, nontender, nondistended, + BS  No hepatomegaly  MS: no deformity Moving all extremities   Skin: warm and dry, no rash Neuro:  Strength and sensation are intact Psych: euthymic mood, full affect   EKG:  EKG is not ordered today.   Lipid Panel No results found for: CHOL, TRIG, HDL, CHOLHDL, VLDL, LDLCALC, LDLDIRECT    Wt Readings from Last 3 Encounters:  08/24/16 76.4 kg (168 lb 6.4 oz)  06/06/15 76.7 kg (169 lb)  04/25/15 77.1 kg (170 lb)      ASSESSMENT AND PLAN:  1.  PV dz   Echo shows PV gradient mild, unchanged  PI remains mild   Continue to follow with periodic echoes  2  Dyspnea  I walked with the pt with pulse ox  HR stable O2 sats stayed at 95%  Rate of walking was good I am not convinced of any active cardiopulmonary problem   I encouraged him to stay active  Make sure he stays hydrated   F/U in 18 months  r Signed, Dietrich PatesPaula Tanyon Alipio, MD  08/24/2016 12:19 PM    [facility] [address], [place], [place]  [address] Phone: [phone](336)  [phone]; Fax: [phone]

## 2016-08-24 ENCOUNTER — Encounter: Payer: Self-pay | Admitting: Internal Medicine

## 2016-08-24 ENCOUNTER — Ambulatory Visit (HOSPITAL_COMMUNITY): Payer: Self-pay | Attending: Cardiovascular Disease

## 2016-08-24 ENCOUNTER — Ambulatory Visit (INDEPENDENT_AMBULATORY_CARE_PROVIDER_SITE_OTHER): Payer: Self-pay | Admitting: Internal Medicine

## 2016-08-24 ENCOUNTER — Other Ambulatory Visit: Payer: Self-pay

## 2016-08-24 VITALS — BP 104/50 | HR 56 | Ht 72.0 in | Wt 168.4 lb

## 2016-08-24 DIAGNOSIS — I371 Nonrheumatic pulmonary valve insufficiency: Secondary | ICD-10-CM | POA: Insufficient documentation

## 2016-08-24 DIAGNOSIS — R0609 Other forms of dyspnea: Secondary | ICD-10-CM

## 2016-08-24 DIAGNOSIS — I370 Nonrheumatic pulmonary valve stenosis: Secondary | ICD-10-CM

## 2016-08-24 DIAGNOSIS — Z87891 Personal history of nicotine dependence: Secondary | ICD-10-CM | POA: Insufficient documentation

## 2016-08-24 NOTE — Patient Instructions (Addendum)
Your physician wants you to follow-up in: 18 months with Dr. Tenny Crawoss.  You will receive a reminder letter in the mail two months in advance. If you don't receive a letter, please call our office to schedule the follow-up appointment.

## 2019-08-11 ENCOUNTER — Other Ambulatory Visit: Payer: Self-pay

## 2019-08-11 ENCOUNTER — Encounter (HOSPITAL_COMMUNITY): Payer: Self-pay

## 2019-08-11 DIAGNOSIS — Z87891 Personal history of nicotine dependence: Secondary | ICD-10-CM | POA: Insufficient documentation

## 2019-08-11 DIAGNOSIS — Z792 Long term (current) use of antibiotics: Secondary | ICD-10-CM | POA: Insufficient documentation

## 2019-08-11 DIAGNOSIS — Y9389 Activity, other specified: Secondary | ICD-10-CM | POA: Insufficient documentation

## 2019-08-11 DIAGNOSIS — M7918 Myalgia, other site: Secondary | ICD-10-CM | POA: Insufficient documentation

## 2019-08-11 DIAGNOSIS — J45909 Unspecified asthma, uncomplicated: Secondary | ICD-10-CM | POA: Insufficient documentation

## 2019-08-11 DIAGNOSIS — Y9289 Other specified places as the place of occurrence of the external cause: Secondary | ICD-10-CM | POA: Insufficient documentation

## 2019-08-11 DIAGNOSIS — Y999 Unspecified external cause status: Secondary | ICD-10-CM | POA: Insufficient documentation

## 2019-08-11 NOTE — ED Notes (Signed)
Pt called for triage. No answer x1! 
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
## 2019-08-12 ENCOUNTER — Encounter (HOSPITAL_COMMUNITY): Payer: Self-pay | Admitting: Student

## 2019-08-12 ENCOUNTER — Emergency Department (HOSPITAL_COMMUNITY)
Admission: EM | Admit: 2019-08-12 | Discharge: 2019-08-12 | Disposition: A | Discharge status: Home / Self Care | Payer: Self-pay | Attending: Emergency Medicine | Admitting: Emergency Medicine
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
## 2019-08-17 NOTE — Progress Notes (Deleted)
Cardiology Office Note   Date:  08/17/2019   ID:  [name], DOB [birth year], MRN [id number]  PCP:  Default, Provider, MD  Cardiologist:   Dietrich Pates, MD   F/U of pulmonic valve dz    History of Present Illness: Roger Dudley is a 34 y.o. male with a history of PS  I saw him in 59  He underwent pulm valvuloplasty at age 68.  Last echo showed no signif stenosis.  Gradient across valve 15 mm Hg  Mild PI  Since seen the pt has done well  NO CP  Occaisonally gets head rushes is stands quick or bends Drinks ample fluids Still smoking  Working at cutting back  Down to 10 cigs per day    I saw the ptin 2018    Outpatient Medications Prior to Visit  Medication Sig Dispense Refill  . amoxicillin (AMOXIL) 500 MG tablet Take 4 tablets (2000 mg) one hour prior to dental procedure 8 tablet 1   No facility-administered medications prior to visit.     Allergies:   Aspirin   Past Medical History:  Diagnosis Date  . Asthma   . Heart murmur     Past Surgical History:  Procedure Laterality Date  . PULMONARY ARTERY BALLOON ANGIOPLASTY    . PULMONARY VALVULOPLASTY  1991     Social History:  The patient  reports that he has quit smoking. His smoking use included cigarettes. He has never used smokeless tobacco. He reports current alcohol use. He reports current drug use. Drug: Marijuana.   Family History:  The patient's family history includes CAD in an other family member; CVA in an other family member; Heart Problems in an other family member; Hyperlipidemia in an other family member; Hypertension in an other family member; Seizures in an other family member; Stroke in an other family member.    ROS:  Please see the history of present illness. All other systems are reviewed and  Negative to the above problem except as noted.    PHYSICAL EXAM: VS:  There were no vitals taken for this visit.  GEN: Well nourished, well developed, in no acute distress HEENT: normal Neck:  no JVD, carotid bruits, or masses Cardiac: RRR; Gr II/VI systolic murmur LUSB, rubs, or gallops,no edema  Respiratory:  clear to auscultation bilaterally, normal work of breathing GI: soft, nontender, nondistended, + BS  No hepatomegaly  MS: no deformity Moving all extremities   Skin: warm and dry, no rash Neuro:  Strength and sensation are intact Psych: euthymic mood, full affect   EKG:  EKG is not ordered today.   Lipid Panel No results found for: CHOL, TRIG, HDL, CHOLHDL, VLDL, LDLCALC, LDLDIRECT    Wt Readings from Last 3 Encounters:  08/11/19 200 lb (90.7 kg)  08/24/16 168 lb 6.4 oz (76.4 kg)  06/06/15 169 lb (76.7 kg)      ASSESSMENT AND PLAN:  1.  PV dz  Exma without diastolic murmur Last echo was in 2012  No signs of RV enlargement on exam I wuld continue to follow  Pt is without insurance  When he gets insurance I would get echo to evaluate valve  No restriction on acitvity  2.  Tob  Counselled at length on quitting    F/U in 1 year  He will call when he has insurance   Signed, Dietrich Pates, MD  08/17/2019 9:50 PM    [facility] [address] [place], [place], [place]  [address] Phone: [phone]; Fax: [phone]

## 2019-08-18 ENCOUNTER — Ambulatory Visit: Payer: Self-pay | Admitting: Internal Medicine
